# Patient Record
Sex: Male | Born: 2000 | Race: Black or African American | Hispanic: No | Marital: Single | Smoking: Never smoker
Health system: Southern US, Community
[De-identification: ages and names within clinical notes are randomized; demographics above are authoritative.]

## PROBLEM LIST (undated history)

## (undated) DIAGNOSIS — J45909 Unspecified asthma, uncomplicated: Secondary | ICD-10-CM

## (undated) HISTORY — PX: EYE SURGERY: SHX253

---
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
## 2018-03-06 ENCOUNTER — Encounter (HOSPITAL_BASED_OUTPATIENT_CLINIC_OR_DEPARTMENT_OTHER): Payer: Self-pay | Admitting: Emergency Medicine

## 2018-03-06 ENCOUNTER — Other Ambulatory Visit: Payer: Self-pay

## 2018-03-06 ENCOUNTER — Emergency Department (HOSPITAL_BASED_OUTPATIENT_CLINIC_OR_DEPARTMENT_OTHER)
Admission: EM | Admit: 2018-03-06 | Discharge: 2018-03-06 | Disposition: A | Discharge status: Home / Self Care | Payer: No Typology Code available for payment source | Attending: Emergency Medicine | Admitting: Emergency Medicine

## 2018-03-06 DIAGNOSIS — R69 Illness, unspecified: Secondary | ICD-10-CM

## 2018-03-06 DIAGNOSIS — J111 Influenza due to unidentified influenza virus with other respiratory manifestations: Secondary | ICD-10-CM | POA: Insufficient documentation

## 2018-03-06 DIAGNOSIS — J45909 Unspecified asthma, uncomplicated: Secondary | ICD-10-CM | POA: Insufficient documentation

## 2018-03-06 MED ORDER — ACETAMINOPHEN 325 MG PO TABS
650.0000 mg | ORAL_TABLET | Freq: Once | ORAL | Status: AC | PRN
  Administered 2018-03-06: 650 mg via ORAL
  Filled 2018-03-06: qty 2

## 2018-03-06 NOTE — Discharge Instructions (Addendum)
Make sure that you drink at least six 8 ounce glasses of water or Gatorade each day in order to stay well-hydrated.  Take Tylenol every 4 hours as needed for aches or for temperature higher than 100.4.  It is not necessary to wake up at night to check your temperature.  If not feeling better in 4 or 5 days, see your primary care physician.  Return if your condition worsens or if concern for any reason

## 2018-03-06 NOTE — ED Provider Notes (Signed)
[facility] EMERGENCY DEPARTMENT Provider Note   CSN: [id number] Arrival date & time: 03/06/18  0734     History   Chief Complaint Chief Complaint  Patient presents with  . Cough    HPI Phillip Mitchell is a 17 y.o. male.  HPI Patient reports "I think I have the flu" he complains of nonproductive cough nasal congestion diffuse myalgias, subjective fever and 2 episodes of posttussive vomiting onset yesterday.  He denies nausea presently denies shortness of breath admits to mild sore throat treat himself with Alka-Seltzer last night with partial relief.  No other associated symptoms.  Nothing makes symptoms better or worse Past Medical History:  Diagnosis Date  . Asthma     There are no active problems to display for this patient.   Past Surgical History:  Procedure Laterality Date  . EYE SURGERY          Home Medications    Prior to Admission medications   Medication Sig Start Date End Date Taking? Authorizing Provider  dextromethorphan-guaiFENesin (MUCINEX DM) 30-600 MG 12hr tablet Take 1 tablet by mouth 2 (two) times daily as needed for cough. 01/04/17  Yes Lyndal PulleyKnott, Daniel, MD  ibuprofen (ADVIL,MOTRIN) 600 MG tablet Take 1 tablet (600 mg total) by mouth 3 (three) times daily with meals as needed. 10/28/16  Yes Loren RacerYelverton, David, MD  naproxen (NAPROSYN) 500 MG tablet Take 1 tablet (500 mg total) by mouth 2 (two) times daily with a meal. 08/08/14  Yes Harris, Abigail, PA-C  albuterol (PROVENTIL HFA;VENTOLIN HFA) 108 (90 BASE) MCG/ACT inhaler Inhale 2 puffs into the lungs every 4 (four) hours as needed for wheezing (wheezing). 08/26/13   Samuel JesterMcManus, Kathleen, DO  mometasone-formoterol (DULERA) 200-5 MCG/ACT AERO Inhale 2 puffs into the lungs daily as needed for wheezing (wheezing).     [provider]    Family History History reviewed. No pertinent family history.  Social History Social History   Tobacco Use  . Smoking status: Never Smoker  . Smokeless  tobacco: Never Used  Substance Use Topics  . Alcohol use: No  . Drug use: Yes    Types: Marijuana     Allergies   Patient has no known allergies.   Review of Systems Review of Systems  Constitutional: Positive for fever.  HENT: Positive for congestion.   Respiratory: Positive for cough.   Cardiovascular: Negative.   Gastrointestinal: Positive for vomiting.  Musculoskeletal: Positive for myalgias.  Skin: Negative.   Neurological: Negative.   Psychiatric/Behavioral: Negative.   All other systems reviewed and are negative.    Physical Exam Updated Vital Signs BP 119/84 (BP Location: Left Arm)   Pulse (!) 106   Temp 100.3 F (37.9 C) (Oral)   Resp 16   Ht 6' (1.829 m)   Wt 79.4 kg (175 lb)   SpO2 97%   BMI 23.73 kg/m   Physical Exam  Constitutional: He appears well-developed and well-nourished. No distress.  HENT:  Head: Normocephalic and atraumatic.  Right Ear: External ear normal.  Left Ear: External ear normal.  Mouth/Throat: Oropharynx is clear and moist. No oropharyngeal exudate.  Nasal congestion  Eyes: Pupils are equal, round, and reactive to light. Conjunctivae are normal.  Neck: Neck supple. No tracheal deviation present. No thyromegaly present.  Cardiovascular: Regular rhythm and normal heart sounds.  No murmur heard. Pulmonary/Chest: Effort normal and breath sounds normal.  Abdominal: Soft. Bowel sounds are normal. He exhibits no distension. There is no tenderness.  Musculoskeletal: Normal range of motion. He  exhibits no edema or tenderness.  Neurological: He is alert. Coordination normal.  Skin: Skin is warm and dry. No rash noted.  Psychiatric: He has a normal mood and affect.  Nursing note and vitals reviewed.    ED Treatments / Results  Labs (all labs ordered are listed, but only abnormal results are displayed) Labs Reviewed - No data to display  EKG None  Radiology No results found.  Procedures Procedures (including critical care  time)  Medications Ordered in ED Medications  acetaminophen (TYLENOL) tablet 650 mg (650 mg Oral Given 03/06/18 0759)     Initial Impression / Assessment and Plan / ED Course  I have reviewed the triage vital signs and the nursing notes.  Pertinent labs & imaging results that were available during my care of the patient were reviewed by me and considered in my medical decision making (see chart for details).     Further diagnostics not indicated discussed with patient and with father who agree.  Plan Tylenol.  Encourage oral hydration.  See PMD if not improved in 4 or 5 days. Pulse oximetry normal Final Clinical Impressions(s) / ED Diagnoses  Diagnosis influenza-like illness Final diagnoses:  None    ED Discharge Orders    None       Doug Sou, MD 03/06/18 [phone]

## 2018-03-06 NOTE — ED Triage Notes (Signed)
Pt c/o productive cough onset Friday.  Pt reports feeling nauseated yesterday and vomited x 2 after coughing. Pt reports best friend with similar symptoms.

## 2018-03-23 IMAGING — CR DG WRIST COMPLETE 3+V*R*
4 series · 4 of 4 positions shown · non-contrast
Comparison: None.

CLINICAL DATA: Fall last night playing basketball injuring right
wrist. Pain and swelling with limited range of motion.

EXAM:
RIGHT WRIST - COMPLETE 3+ VIEW

[x wrist pa right]
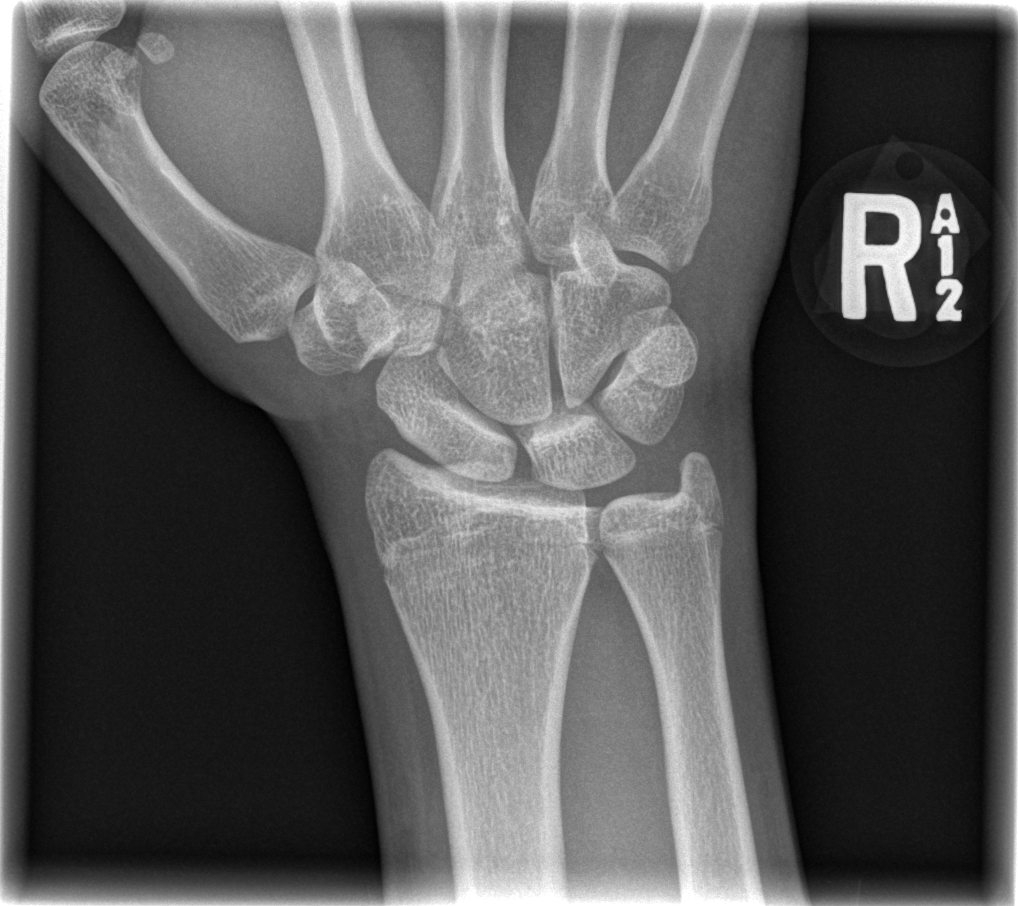

[x wrist obl right]
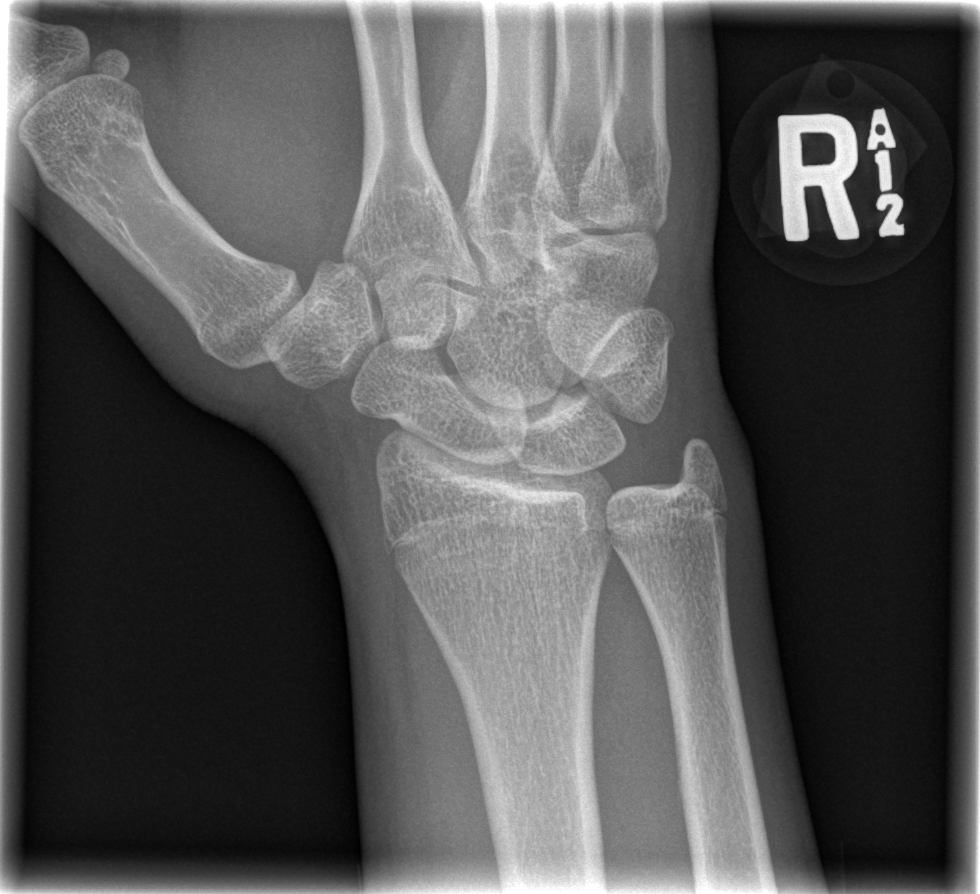

[x wrist lat right]
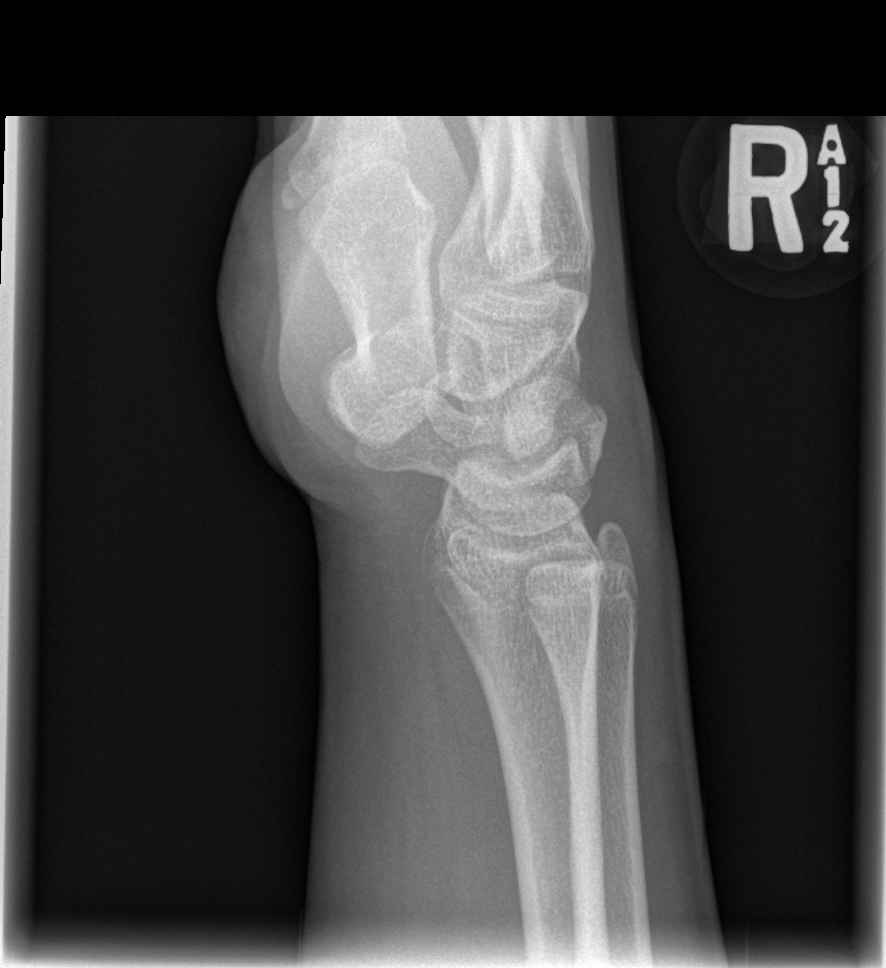

[x navicular]
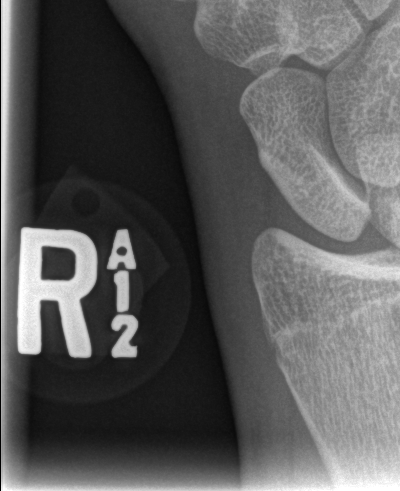

[4 of 4 positions shown; findings below may reference images not displayed]

FINDINGS: There is no evidence of fracture or dislocation. There is no
evidence of arthropathy or other focal bone abnormality. Scaphoid is
intact. The growth plates are fusing. Soft tissues are unremarkable.
IMPRESSION: Negative radiographs of the right wrist.

## 2021-01-08 DIAGNOSIS — R55 Syncope and collapse: Secondary | ICD-10-CM | POA: Diagnosis not present

## 2021-01-08 DIAGNOSIS — R42 Dizziness and giddiness: Secondary | ICD-10-CM | POA: Diagnosis not present

## 2021-01-08 DIAGNOSIS — F17200 Nicotine dependence, unspecified, uncomplicated: Secondary | ICD-10-CM | POA: Diagnosis not present

## 2021-01-08 DIAGNOSIS — R7989 Other specified abnormal findings of blood chemistry: Secondary | ICD-10-CM | POA: Diagnosis not present

## 2021-03-17 DIAGNOSIS — J101 Influenza due to other identified influenza virus with other respiratory manifestations: Secondary | ICD-10-CM | POA: Diagnosis not present

## 2022-09-14 DIAGNOSIS — R350 Frequency of micturition: Secondary | ICD-10-CM | POA: Diagnosis not present

## 2022-09-14 DIAGNOSIS — Z0001 Encounter for general adult medical examination with abnormal findings: Secondary | ICD-10-CM | POA: Diagnosis not present

## 2022-09-14 DIAGNOSIS — M25531 Pain in right wrist: Secondary | ICD-10-CM | POA: Diagnosis not present

## 2022-09-14 DIAGNOSIS — Z23 Encounter for immunization: Secondary | ICD-10-CM | POA: Diagnosis not present

## 2023-09-16 DIAGNOSIS — Z23 Encounter for immunization: Secondary | ICD-10-CM | POA: Diagnosis not present

## 2023-09-16 DIAGNOSIS — R29898 Other symptoms and signs involving the musculoskeletal system: Secondary | ICD-10-CM | POA: Diagnosis not present

## 2023-09-16 DIAGNOSIS — J302 Other seasonal allergic rhinitis: Secondary | ICD-10-CM | POA: Diagnosis not present

## 2023-09-16 DIAGNOSIS — F17210 Nicotine dependence, cigarettes, uncomplicated: Secondary | ICD-10-CM | POA: Diagnosis not present

## 2023-09-16 DIAGNOSIS — Z113 Encounter for screening for infections with a predominantly sexual mode of transmission: Secondary | ICD-10-CM | POA: Diagnosis not present

## 2023-09-16 DIAGNOSIS — M25531 Pain in right wrist: Secondary | ICD-10-CM | POA: Diagnosis not present

## 2023-09-16 DIAGNOSIS — Z136 Encounter for screening for cardiovascular disorders: Secondary | ICD-10-CM | POA: Diagnosis not present

## 2023-09-16 DIAGNOSIS — Z0001 Encounter for general adult medical examination with abnormal findings: Secondary | ICD-10-CM | POA: Diagnosis not present

## 2023-09-16 DIAGNOSIS — R350 Frequency of micturition: Secondary | ICD-10-CM | POA: Diagnosis not present

## 2023-09-16 DIAGNOSIS — Z131 Encounter for screening for diabetes mellitus: Secondary | ICD-10-CM | POA: Diagnosis not present
